# Patient Record
Sex: Female | Born: 1945 | Race: Black or African American | Hispanic: No | Marital: Married
Health system: Southern US, Community
[De-identification: ages and names within clinical notes are randomized; demographics above are authoritative.]

---

## 2019-11-24 ENCOUNTER — Observation Stay (HOSPITAL_COMMUNITY): Payer: Medicare Other

## 2019-11-24 ENCOUNTER — Encounter (HOSPITAL_COMMUNITY): Payer: Self-pay | Admitting: Family Medicine

## 2019-11-24 ENCOUNTER — Inpatient Hospital Stay (HOSPITAL_COMMUNITY)
Admission: EM | Admit: 2019-11-24 | Discharge: 2019-12-26 | DRG: 177 | Disposition: E | Payer: Medicare Other | Source: Ambulatory Visit | Attending: Internal Medicine | Admitting: Internal Medicine

## 2019-11-24 ENCOUNTER — Emergency Department (HOSPITAL_COMMUNITY): Payer: Medicare Other

## 2019-11-24 DIAGNOSIS — R627 Adult failure to thrive: Secondary | ICD-10-CM | POA: Diagnosis present

## 2019-11-24 DIAGNOSIS — J9601 Acute respiratory failure with hypoxia: Secondary | ICD-10-CM | POA: Diagnosis present

## 2019-11-24 DIAGNOSIS — Z66 Do not resuscitate: Secondary | ICD-10-CM

## 2019-11-24 DIAGNOSIS — E785 Hyperlipidemia, unspecified: Secondary | ICD-10-CM | POA: Diagnosis present

## 2019-11-24 DIAGNOSIS — Z992 Dependence on renal dialysis: Secondary | ICD-10-CM

## 2019-11-24 DIAGNOSIS — Z881 Allergy status to other antibiotic agents status: Secondary | ICD-10-CM

## 2019-11-24 DIAGNOSIS — M2450 Contracture, unspecified joint: Secondary | ICD-10-CM | POA: Diagnosis present

## 2019-11-24 DIAGNOSIS — G9340 Encephalopathy, unspecified: Secondary | ICD-10-CM | POA: Diagnosis not present

## 2019-11-24 DIAGNOSIS — G9341 Metabolic encephalopathy: Secondary | ICD-10-CM | POA: Diagnosis present

## 2019-11-24 DIAGNOSIS — J440 Chronic obstructive pulmonary disease with acute lower respiratory infection: Secondary | ICD-10-CM | POA: Diagnosis present

## 2019-11-24 DIAGNOSIS — J159 Unspecified bacterial pneumonia: Secondary | ICD-10-CM | POA: Diagnosis present

## 2019-11-24 DIAGNOSIS — E875 Hyperkalemia: Secondary | ICD-10-CM | POA: Diagnosis present

## 2019-11-24 DIAGNOSIS — U071 COVID-19: Secondary | ICD-10-CM | POA: Diagnosis not present

## 2019-11-24 DIAGNOSIS — N186 End stage renal disease: Secondary | ICD-10-CM | POA: Diagnosis not present

## 2019-11-24 DIAGNOSIS — Z7401 Bed confinement status: Secondary | ICD-10-CM

## 2019-11-24 DIAGNOSIS — E039 Hypothyroidism, unspecified: Secondary | ICD-10-CM | POA: Diagnosis present

## 2019-11-24 DIAGNOSIS — J449 Chronic obstructive pulmonary disease, unspecified: Secondary | ICD-10-CM | POA: Diagnosis present

## 2019-11-24 DIAGNOSIS — I953 Hypotension of hemodialysis: Secondary | ICD-10-CM | POA: Diagnosis present

## 2019-11-24 DIAGNOSIS — I469 Cardiac arrest, cause unspecified: Secondary | ICD-10-CM | POA: Diagnosis not present

## 2019-11-24 DIAGNOSIS — J1282 Pneumonia due to coronavirus disease 2019: Secondary | ICD-10-CM | POA: Diagnosis present

## 2019-11-24 DIAGNOSIS — Z515 Encounter for palliative care: Secondary | ICD-10-CM

## 2019-11-24 DIAGNOSIS — D7589 Other specified diseases of blood and blood-forming organs: Secondary | ICD-10-CM | POA: Diagnosis present

## 2019-11-24 DIAGNOSIS — Z888 Allergy status to other drugs, medicaments and biological substances status: Secondary | ICD-10-CM

## 2019-11-24 DIAGNOSIS — F419 Anxiety disorder, unspecified: Secondary | ICD-10-CM | POA: Diagnosis present

## 2019-11-24 HISTORY — DX: Anxiety disorder, unspecified: F41.9

## 2019-11-24 HISTORY — DX: End stage renal disease: N18.6

## 2019-11-24 HISTORY — DX: Chronic obstructive pulmonary disease, unspecified: J44.9

## 2019-11-24 LAB — CBC WITH DIFFERENTIAL/PLATELET
Abs Immature Granulocytes: 0 10*3/uL (ref 0.00–0.07)
Basophils Absolute: 0 10*3/uL (ref 0.0–0.1)
Basophils Relative: 0 %
Eosinophils Absolute: 0 10*3/uL (ref 0.0–0.5)
Eosinophils Relative: 0 %
HCT: 39.5 % (ref 36.0–46.0)
Hemoglobin: 12 g/dL (ref 12.0–15.0)
Lymphocytes Relative: 2 %
Lymphs Abs: 0.5 10*3/uL — ABNORMAL LOW (ref 0.7–4.0)
MCH: 31.3 pg (ref 26.0–34.0)
MCHC: 30.4 g/dL (ref 30.0–36.0)
MCV: 103.1 fL — ABNORMAL HIGH (ref 80.0–100.0)
Monocytes Absolute: 0.9 10*3/uL (ref 0.1–1.0)
Monocytes Relative: 4 %
Neutro Abs: 21.4 10*3/uL — ABNORMAL HIGH (ref 1.7–7.7)
Neutrophils Relative %: 94 %
Platelets: 499 10*3/uL — ABNORMAL HIGH (ref 150–400)
RBC: 3.83 MIL/uL — ABNORMAL LOW (ref 3.87–5.11)
RDW: 12.8 % (ref 11.5–15.5)
WBC: 22.8 10*3/uL — ABNORMAL HIGH (ref 4.0–10.5)
nRBC: 0 % (ref 0.0–0.2)
nRBC: 0 /100 WBC

## 2019-11-24 LAB — POC SARS CORONAVIRUS 2 AG -  ED: SARS Coronavirus 2 Ag: POSITIVE — AB

## 2019-11-24 LAB — TRIGLYCERIDES: Triglycerides: 364 mg/dL — ABNORMAL HIGH (ref <150)

## 2019-11-24 LAB — BASIC METABOLIC PANEL
Anion gap: 19 — ABNORMAL HIGH (ref 5–15)
BUN: 77 mg/dL — ABNORMAL HIGH (ref 8–23)
CO2: 26 mmol/L (ref 22–32)
Calcium: 9.3 mg/dL (ref 8.9–10.3)
Chloride: 96 mmol/L — ABNORMAL LOW (ref 98–111)
Creatinine, Ser: 7.03 mg/dL — ABNORMAL HIGH (ref 0.44–1.00)
GFR calc Af Amer: 6 mL/min — ABNORMAL LOW (ref >60)
GFR calc non Af Amer: 5 mL/min — ABNORMAL LOW (ref >60)
Glucose, Bld: 144 mg/dL — ABNORMAL HIGH (ref 70–99)
Potassium: 5.6 mmol/L — ABNORMAL HIGH (ref 3.5–5.1)
Sodium: 141 mmol/L (ref 135–145)

## 2019-11-24 LAB — PROCALCITONIN: Procalcitonin: 2.21 ng/mL

## 2019-11-24 LAB — HIV ANTIBODY (ROUTINE TESTING W REFLEX): HIV Screen 4th Generation wRfx: NONREACTIVE

## 2019-11-24 LAB — LACTIC ACID, PLASMA: Lactic Acid, Venous: 1.1 mmol/L (ref 0.5–1.9)

## 2019-11-24 LAB — VITAMIN B12: Vitamin B-12: 870 pg/mL (ref 180–914)

## 2019-11-24 LAB — AMMONIA: Ammonia: 43 umol/L — ABNORMAL HIGH (ref 9–35)

## 2019-11-24 LAB — D-DIMER, QUANTITATIVE: D-Dimer, Quant: 1.03 ug/mL-FEU — ABNORMAL HIGH (ref 0.00–0.50)

## 2019-11-24 LAB — FERRITIN: Ferritin: 5834 ng/mL — ABNORMAL HIGH (ref 11–307)

## 2019-11-24 LAB — TSH: TSH: 0.624 u[IU]/mL (ref 0.350–4.500)

## 2019-11-24 LAB — FIBRINOGEN: Fibrinogen: >800 mg/dL — ABNORMAL HIGH (ref 210–475)

## 2019-11-24 LAB — C-REACTIVE PROTEIN: CRP: 14.3 mg/dL — ABNORMAL HIGH (ref <1.0)

## 2019-11-24 LAB — LACTATE DEHYDROGENASE: LDH: 220 U/L — ABNORMAL HIGH (ref 98–192)

## 2019-11-24 MED ORDER — APIXABAN 2.5 MG PO TABS: 2.5000 mg | ORAL_TABLET | Freq: Two times a day (BID) | ORAL | Status: DC

## 2019-11-24 MED ORDER — LEVOTHYROXINE SODIUM 75 MCG PO TABS
150.0000 ug | ORAL_TABLET | Freq: Every day | ORAL | Status: DC
  Administered 2019-11-25: 05:00:00 150 ug via ORAL
  Filled 2019-11-24: qty 2

## 2019-11-24 MED ORDER — DEXAMETHASONE SODIUM PHOSPHATE 10 MG/ML IJ SOLN
6.0000 mg | INTRAMUSCULAR | Status: DC
  Administered 2019-11-24 – 2019-11-26 (×3): 6 mg via INTRAVENOUS
  Filled 2019-11-24 (×3): qty 1

## 2019-11-24 MED ORDER — SODIUM CHLORIDE 0.9 % IV SOLN
100.0000 mg | INTRAVENOUS | Status: DC
  Administered 2019-11-25: 100 mg via INTRAVENOUS
  Filled 2019-11-24: qty 100

## 2019-11-24 MED ORDER — SODIUM CHLORIDE 0.9% FLUSH
3.0000 mL | Freq: Two times a day (BID) | INTRAVENOUS | Status: DC
  Administered 2019-11-25 – 2019-11-28 (×8): 3 mL via INTRAVENOUS

## 2019-11-24 MED ORDER — SODIUM CHLORIDE 0.9 % IV SOLN
200.0000 mg | Freq: Once | INTRAVENOUS | Status: AC
  Administered 2019-11-24: 200 mg via INTRAVENOUS
  Filled 2019-11-24: qty 200

## 2019-11-24 MED ORDER — SODIUM CHLORIDE 0.9 % IV SOLN: 200.0000 mg | Freq: Once | INTRAVENOUS | Status: DC

## 2019-11-24 MED ORDER — ONDANSETRON HCL 4 MG PO TABS: 4.0000 mg | ORAL_TABLET | Freq: Four times a day (QID) | ORAL | Status: DC | PRN

## 2019-11-24 MED ORDER — HEPARIN SODIUM (PORCINE) 5000 UNIT/ML IJ SOLN
5000.0000 [IU] | Freq: Three times a day (TID) | INTRAMUSCULAR | Status: DC
  Administered 2019-11-24 – 2019-11-26 (×7): 5000 [IU] via SUBCUTANEOUS
  Filled 2019-11-24 (×7): qty 1

## 2019-11-24 MED ORDER — IPRATROPIUM-ALBUTEROL 20-100 MCG/ACT IN AERS
1.0000 | INHALATION_SPRAY | Freq: Four times a day (QID) | RESPIRATORY_TRACT | Status: DC
  Administered 2019-11-24 – 2019-11-25 (×5): 1 via RESPIRATORY_TRACT
  Filled 2019-11-24 (×2): qty 4

## 2019-11-24 MED ORDER — SODIUM CHLORIDE 0.9 % IV SOLN: 100.0000 mg | Freq: Every day | INTRAVENOUS | Status: DC

## 2019-11-24 MED ORDER — ONDANSETRON HCL 4 MG/2ML IJ SOLN: 4.0000 mg | Freq: Four times a day (QID) | INTRAMUSCULAR | Status: DC | PRN

## 2019-11-24 MED ORDER — ACETAMINOPHEN 325 MG PO TABS: 650.0000 mg | ORAL_TABLET | Freq: Four times a day (QID) | ORAL | Status: DC | PRN

## 2019-11-24 MED ORDER — ALBUTEROL SULFATE HFA 108 (90 BASE) MCG/ACT IN AERS
2.0000 | INHALATION_SPRAY | RESPIRATORY_TRACT | Status: DC | PRN
  Filled 2019-11-24: qty 6.7

## 2019-11-24 NOTE — ED Triage Notes (Signed)
Pt here from dialysis center for low b/p , pt may or may not have had dialysis yesterday , pt is from [facility] and was cvoid positive on dec 23rd

## 2019-11-24 NOTE — H&P (Addendum)
History and Physical    Jeanette Simon [id number] DOB: 12/31/1945 DOA: 11/07/2019  PCP: Patient, No Pcp Per   Patient coming from: SNF, [facility]   Chief Complaint: Decreased LOC, low BP  HPI: Jeanette Simon is a 73 y.o. female with medical history significant for COPD, anxiety, ESRD on hemodialysis, now presenting from her dialysis center where she had systolic blood pressure in the upper 90s/low 100s.  Per report of the patient's SNF, she was diagnosed with COVID-19 approximately a week ago, has had a new oxygen requirement, and has had decreased alertness over the past week.  Patient's family reports that she is typically alert and interactive.  Patient's family is concerned about her ongoing tachypnea and decreased level of consciousness.  No recent fall or trauma reported and no acute focal deficits have been noted.  ED Course: Upon arrival to the ED, patient is found to be afebrile, saturating high 90s on 3 L/min of supplemental oxygen, tachypneic in the low 30s, and with blood pressure 93/53.  Chest x-ray is negative for edema or consolidation.  Chemistry panel notable for BUN of 77 and potassium of 5.6.  CBC with leukocytosis to 22,800, macrocytosis without anemia, and thrombocytosis.  Covid antigen test was positive.  EKG and blood work have been ordered from the ED but not yet collected, and hospitalist asked to admit.  Review of Systems:  Unable to complete ROS secondary the patient's clinical condition.  Past Medical History:  Diagnosis Date  . Anxiety 11/20/2019  . COPD (chronic obstructive pulmonary disease) ([place]) 11/19/2019  . ESRD (end stage renal disease) ([place]) 11/23/2019    History reviewed. No pertinent surgical history.   has no history on file for tobacco, alcohol, and drug.  Allergies  Allergen Reactions  . Augmentin [Amoxicillin-Pot Clavulanate]     Unknown, per records  . Compazine [Prochlorperazine]     Unknown, per records  . Gabapentin Nausea And  Vomiting    History reviewed. No pertinent family history.   Prior to Admission medications   Not on File    Physical Exam: Vitals:   11/05/2019 1815 11/14/2019 1830 11/09/2019 1845 11/07/2019 1900  BP: 119/85 124/77 125/72 125/75  Pulse: 100 (!) 103  (!) 101  Resp: (!) 30 (!) 31 (!) 28 (!) 32  Temp:      SpO2: 99% 98%  99%    Constitutional: NAD, calm, frail appearing  Eyes: PERTLA, lids and conjunctivae normal ENMT: Mucous membranes are moist. Posterior pharynx clear of any exudate or lesions.   Neck: normal, supple, no masses, no thyromegaly Respiratory:  no wheezing, no crackles. Tachypneic. No pallor, cyanosis, or acute distress.  Cardiovascular: S1 & S2 heard, regular rate and rhythm. No extremity edema.   Abdomen: No distension, no tenderness, soft. Bowel sounds active.  Musculoskeletal: no clubbing / cyanosis. No joint deformity upper and lower extremities.  Skin: no significant rashes, lesions, ulcers. Warm, dry, well-perfused. Neurologic: No gross facial asymmetry, PERRL, not speaking. Moving all extremities. Lethargic and generally weak, right grip strength > left.  Psychiatric: Calm, makes eye-contact, not speaking.     Labs on Admission: I have personally reviewed following labs and imaging studies  CBC: Recent Labs  Lab 11/06/2019 1348  WBC 22.8*  NEUTROABS 21.4*  HGB 12.0  HCT 39.5  MCV 103.1*  PLT 99991111*   Basic Metabolic Panel: Recent Labs  Lab 11/07/2019 1348  NA 141  K 5.6*  CL 96*  CO2 26  GLUCOSE 144*  BUN 77*  CREATININE 7.03*  CALCIUM 9.3   GFR: CrCl cannot be calculated (Unknown ideal weight.). Liver Function Tests: No results for input(s): AST, ALT, ALKPHOS, BILITOT, PROT, ALBUMIN in the last 168 hours. No results for input(s): LIPASE, AMYLASE in the last 168 hours. No results for input(s): AMMONIA in the last 168 hours. Coagulation Profile: No results for input(s): INR, PROTIME in the last 168 hours. Cardiac Enzymes: No results for  input(s): CKTOTAL, CKMB, CKMBINDEX, TROPONINI in the last 168 hours. BNP (last 3 results) No results for input(s): PROBNP in the last 8760 hours. HbA1C: No results for input(s): HGBA1C in the last 72 hours. CBG: No results for input(s): GLUCAP in the last 168 hours. Lipid Profile: No results for input(s): CHOL, HDL, LDLCALC, TRIG, CHOLHDL, LDLDIRECT in the last 72 hours. Thyroid Function Tests: No results for input(s): TSH, T4TOTAL, FREET4, T3FREE, THYROIDAB in the last 72 hours. Anemia Panel: No results for input(s): VITAMINB12, FOLATE, FERRITIN, TIBC, IRON, RETICCTPCT in the last 72 hours. Urine analysis: No results found for: COLORURINE, APPEARANCEUR, LABSPEC, PHURINE, GLUCOSEU, HGBUR, BILIRUBINUR, KETONESUR, PROTEINUR, UROBILINOGEN, NITRITE, LEUKOCYTESUR Sepsis Labs: @LABRCNTIP (procalcitonin:4,lacticidven:4) )No results found for this or any previous visit (from the past 240 hour(s)).   Radiological Exams on Admission: DG Chest Port 1 View  Result Date: 11/17/2019 CLINICAL DATA:  Weakness and hypotension.  Renal failure. EXAM: PORTABLE CHEST 1 VIEW COMPARISON:  None FINDINGS: No edema or consolidation. Heart size and pulmonary vascularity are normal. No adenopathy. There is aortic atherosclerosis. No bone lesions. IMPRESSION: No edema or consolidation. Heart size normal. No adenopathy. Aortic Atherosclerosis (ICD10-I70.0). Electronically Signed   By: Lowella Grip III M.D.   On: 11/10/2019 14:13    EKG: Independently reviewed. Sinus rhythm, rate 99, normal QRS and QTc.   Assessment/Plan   1. Acute encephalopathy  - Presents from HD with decreased LOC and increased RR since last week after COVID-19 diagnosis  - She is alert and interactive at baseline per report, now not speaking though has been following commands  - She is awake on admission and follows commands intermittently, moving all extremities with grip strength better on right than left - DDx is broad, likely toxic  metabolic, sequela of [id number], CVA difficult to exclude  - Check head CT, TSH, B12, ammonia, RPR  - Continue supportive care    2. COVID-19 infection; hypoxic respiratory failure   - Presents with decreased LOC, increased RR, and has had a new 3 Lpm supplemental O2 requirement at her SNF  - No acute findings noted on CXR, saturating upper 90's on 3 Lpm with RR ~30  - Continue steroids, start remdesivir, continue supplemental O2 as needed to keep sat >90% while awake, check/trend inflammatory markers    3. COPD  - No wheezing on admission  - Continue inhalers, steroids as above, supplemental O2 as needed   4. ESRD; hyperkalemia  - Patient reportedly completed HD on 12/30, was sent again on 12/31 for unclear indication, and has mild hyperkalemia (5.6) and BUN 77 on admission, not hypertensive or hypervolemic  - Check EKG, continue cardiac monitoring, repeat potassium levels, SLIV, renally-dose medications   5. SIRS  - WBC is 22k and patient is tachypneic and tachycardic with new O2 requirement  - Likely secondary to COVID-19 and recent steroid use, blood cultures and procalcitonin are pending, hold antibiotics for now    6. Hypothyroidism  - Check TSH as above, continue Synthroid     DVT prophylaxis: Eliquis   Code Status: DNR, paperwork at bedside  Family Communication: Family updated from ED  Consults called: None  Admission status: Observation     Vianne Bulls, MD [facility] Pager [phone]  If 7PM-7AM, please contact night-coverage [email or web address] Password [email or web address]  11/08/2019, 7:40 PM

## 2019-11-24 NOTE — ED Provider Notes (Signed)
I assumed care of patient from previous team, please see their note for full H&P.  Briefly patient is known Covid positive here from [facility].  She went to dialysis and was reportedly hypotensive compared to her normal and was sent here.  Physical Exam  BP 102/74   Pulse 96   Temp 98.4 F (36.9 C)   Resp (!) 31   SpO2 100%   Physical Exam Vitals and nursing note reviewed.  Constitutional:      Interventions: Nasal cannula in place.  Cardiovascular:     Rate and Rhythm: Tachycardia present.  Pulmonary:     Effort: Tachypnea present. No prolonged expiration.  Neurological:     Mental Status: She is easily aroused.     GCS: GCS eye subscore is 3. GCS verbal subscore is 2. GCS motor subscore is 5.     Comments: Patient opens eyes to name, will look at me.  She is unable to follow commands, unable to squeeze fingers bilaterally.  She is unable to close her eyes when directed to.       ED Course/Procedures   Clinical Course as of Nov 23 1936  Thu Nov 24, 2019  1517 Call to [facility], spoke with patient's nurse, patient was more alert last week, given O2 last week. Dialysis yesterday, was taken back to dialysis today for unknown reason. Not as talkative today, not normally alert to person/place/time. BP 112/69 earlier today with sat 94% (on 3L [place]).  Brought in by EMS from dialysis for low BP today. BP 102/74 currently. Patient does not answer questions, is awake, follows simple commands (squeeze hands). Has MOST form at bedside (necessary abx and fluids, do not resuscitate).    [LM]  [place] signed out to Wyn Quaker, PA-C pending BMP.   [LM]  [place] son [id number] Janina Mayo daughter [phone]   [EH]  [id number] Spoke with Dr. Myna Hidalgo who will see patient.     [EH]    Clinical Course User Index [EH] Lorin Glass, PA-C [LM] Tacy Learn, PA-C    Procedures   Labs Reviewed  BASIC METABOLIC PANEL - Abnormal; Notable for the following components:       Result Value   Potassium 5.6 (*)    Chloride 96 (*)    Glucose, Bld 144 (*)    BUN 77 (*)    Creatinine, Ser 7.03 (*)    GFR calc non Af Amer 5 (*)    GFR calc Af Amer 6 (*)    Anion gap 19 (*)    All other components within normal limits  CBC WITH DIFFERENTIAL/PLATELET - Abnormal; Notable for the following components:   WBC 22.8 (*)    RBC 3.83 (*)    MCV 103.1 (*)    Platelets 499 (*)    Neutro Abs 21.4 (*)    Lymphs Abs 0.5 (*)    All other components within normal limits  D-DIMER, QUANTITATIVE (NOT AT [facility]) - Abnormal; Notable for the following components:   D-Dimer, Quant 1.03 (*)    All other components within normal limits  LACTATE DEHYDROGENASE - Abnormal; Notable for the following components:   LDH 220 (*)    All other components within normal limits  FERRITIN - Abnormal; Notable for the following components:   Ferritin 5,834 (*)    All other components within normal limits  TRIGLYCERIDES - Abnormal; Notable for the following components:   Triglycerides 364 (*)    All other components within  normal limits  FIBRINOGEN - Abnormal; Notable for the following components:   Fibrinogen >800 (*)    All other components within normal limits  C-REACTIVE PROTEIN - Abnormal; Notable for the following components:   CRP 14.3 (*)    All other components within normal limits  AMMONIA - Abnormal; Notable for the following components:   Ammonia 43 (*)    All other components within normal limits  POC SARS CORONAVIRUS 2 AG -  ED - Abnormal; Notable for the following components:   SARS Coronavirus 2 Ag POSITIVE (*)    All other components within normal limits  CULTURE, BLOOD (ROUTINE X 2)  CULTURE, BLOOD (ROUTINE X 2)  LACTIC ACID, PLASMA  PROCALCITONIN  TSH  VITAMIN B12  HIV ANTIBODY (ROUTINE TESTING W REFLEX)  LACTIC ACID, PLASMA  RPR  POTASSIUM  CBC WITH DIFFERENTIAL/PLATELET  COMPREHENSIVE METABOLIC PANEL  C-REACTIVE PROTEIN  D-DIMER, QUANTITATIVE (NOT AT [facility])   FERRITIN  MAGNESIUM  PHOSPHORUS  ABO/RH     MDM   Patient presents today for evaluation of possible hypotension from dialysis. Plan is to follow-up on labs.  She is known Covid positive.  While here she was tachycardic and tachypneic.  She had been started on oxygen by the facility along with Eliquis.  When I examined her she was unable to consistently respond to commands.  She would follow me with her eyes and open her eyes to voice however was not able to squeeze my hands.  I spoke with patient's family who, after deliberation, wished for patient to be admitted.  Patient was dialyzed yesterday.  Chest x-ray without frank volume overload.  I spoke with Dr. Myna Hidalgo who will see the patient for admission.   Note: Portions of this report may have been transcribed using voice recognition software. Every effort was made to ensure accuracy; however, inadvertent computerized transcription errors may be present         Lorin Glass, PA-C 11/25/19 0104    Maudie Flakes, MD 11/25/19 [phone]

## 2019-11-24 NOTE — ED Provider Notes (Signed)
[facility] EMERGENCY DEPARTMENT Provider Note   CSN: [id number] Arrival date & time: 11/21/2019  1322     History No chief complaint on file.   Jeanette Simon is a 73 y.o. female.  73 year old female history of COPD, end-stage renal disease, hyperlipidemia, anxiety, brought in from dialysis center for low blood pressure today.  Patient recently had a positive Covid test on December 23, review of limited records- was started on Elaquis and Decadron.  Unsure when patient last had dialysis or if she completed dialysis today.  On arrival patient's blood pressure is 100/61.  Patient is unable to provide history.        No past medical history on file.  There are no problems to display for this patient.    OB History   No obstetric history on file.     No family history on file.  Social History   Tobacco Use  . Smoking status: Not on file  Substance Use Topics  . Alcohol use: Not on file  . Drug use: Not on file    Home Medications Prior to Admission medications   Not on File    Allergies    Patient has no allergy information on record.  Review of Systems   Review of Systems  Unable to perform ROS: Patient nonverbal    Physical Exam Updated Vital Signs BP 102/74   Pulse 96   Temp 98.4 F (36.9 C)   Resp (!) 31   SpO2 100%   Physical Exam Vitals and nursing note reviewed.  Constitutional:      Comments: Awake, chronically ill-appearing  HENT:     Head: Normocephalic and atraumatic.  Eyes:     Pupils: Pupils are equal, round, and reactive to light.  Cardiovascular:     Rate and Rhythm: Normal rate and regular rhythm.     Pulses: Normal pulses.     Heart sounds: Normal heart sounds.  Pulmonary:     Effort: Pulmonary effort is normal.     Breath sounds: Normal breath sounds.  Abdominal:     Palpations: Abdomen is soft.     Tenderness: There is no abdominal tenderness.  Musculoskeletal:     Right lower leg: No edema.     Left  lower leg: No edema.  Skin:    General: Skin is warm and dry.  Neurological:     Mental Status: Mental status is at baseline.     Comments: Follows simple commands (squeeze hands).     ED Results / Procedures / Treatments   Labs (all labs ordered are listed, but only abnormal results are displayed) Labs Reviewed  BASIC METABOLIC PANEL - Abnormal; Notable for the following components:      Result Value   Potassium 5.6 (*)    Chloride 96 (*)    Glucose, Bld 144 (*)    BUN 77 (*)    Creatinine, Ser 7.03 (*)    GFR calc non Af Amer 5 (*)    GFR calc Af Amer 6 (*)    Anion gap 19 (*)    All other components within normal limits  CBC WITH DIFFERENTIAL/PLATELET - Abnormal; Notable for the following components:   WBC 22.8 (*)    RBC 3.83 (*)    MCV 103.1 (*)    Platelets 499 (*)    All other components within normal limits    EKG None  Radiology DG Chest Port 1 View  Result Date: 11/10/2019 CLINICAL DATA:  Weakness and hypotension.  Renal failure. EXAM: PORTABLE CHEST 1 VIEW COMPARISON:  None FINDINGS: No edema or consolidation. Heart size and pulmonary vascularity are normal. No adenopathy. There is aortic atherosclerosis. No bone lesions. IMPRESSION: No edema or consolidation. Heart size normal. No adenopathy. Aortic Atherosclerosis (ICD10-I70.0). Electronically Signed   By: Lowella Grip III M.D.   On: 11/12/2019 14:13    Procedures Procedures (including critical care time)  Medications Ordered in ED Medications - No data to display  ED Course  I have reviewed the triage vital signs and the nursing notes.  Pertinent labs & imaging results that were available during my care of the patient were reviewed by me and considered in my medical decision making (see chart for details).  Clinical Course as of Nov 23 1536  Thu Nov 24, 2019  1517 Call to [facility], spoke with patient's nurse, patient was more alert last week, given O2 last week. Dialysis yesterday, was  taken back to dialysis today for unknown reason. Not as talkative today, not normally alert to person/place/time. BP 112/69 earlier today with sat 94% (on 3L [place]).  Brought in by EMS from dialysis for low BP today. BP 102/74 currently. Patient does not answer questions, is awake, follows simple commands (squeeze hands). Has MOST form at bedside (necessary abx and fluids, do not resuscitate).    [LM]  [place] signed out to Wyn Quaker, PA-C pending BMP.   [LM]    Clinical Course User Index [LM] Jeanette Simon   MDM Rules/Calculators/A&P                      Final Clinical Impression(s) / ED Diagnoses Final diagnoses:  None    Rx / DC Orders ED Discharge Orders    None       Jeanette Simon 10/28/2019 1537    Jeanette Rank, MD 11/30/19 2310

## 2019-11-25 DIAGNOSIS — E875 Hyperkalemia: Secondary | ICD-10-CM | POA: Diagnosis present

## 2019-11-25 DIAGNOSIS — Z515 Encounter for palliative care: Secondary | ICD-10-CM | POA: Diagnosis not present

## 2019-11-25 DIAGNOSIS — Z888 Allergy status to other drugs, medicaments and biological substances status: Secondary | ICD-10-CM | POA: Diagnosis not present

## 2019-11-25 DIAGNOSIS — I953 Hypotension of hemodialysis: Secondary | ICD-10-CM | POA: Diagnosis present

## 2019-11-25 DIAGNOSIS — M2450 Contracture, unspecified joint: Secondary | ICD-10-CM | POA: Diagnosis present

## 2019-11-25 DIAGNOSIS — F419 Anxiety disorder, unspecified: Secondary | ICD-10-CM | POA: Diagnosis present

## 2019-11-25 DIAGNOSIS — Z881 Allergy status to other antibiotic agents status: Secondary | ICD-10-CM | POA: Diagnosis not present

## 2019-11-25 DIAGNOSIS — E039 Hypothyroidism, unspecified: Secondary | ICD-10-CM | POA: Diagnosis present

## 2019-11-25 DIAGNOSIS — Z7401 Bed confinement status: Secondary | ICD-10-CM | POA: Diagnosis not present

## 2019-11-25 DIAGNOSIS — E785 Hyperlipidemia, unspecified: Secondary | ICD-10-CM | POA: Diagnosis present

## 2019-11-25 DIAGNOSIS — J159 Unspecified bacterial pneumonia: Secondary | ICD-10-CM | POA: Diagnosis present

## 2019-11-25 DIAGNOSIS — G9340 Encephalopathy, unspecified: Secondary | ICD-10-CM | POA: Diagnosis present

## 2019-11-25 DIAGNOSIS — Z66 Do not resuscitate: Secondary | ICD-10-CM

## 2019-11-25 DIAGNOSIS — G9341 Metabolic encephalopathy: Secondary | ICD-10-CM | POA: Diagnosis present

## 2019-11-25 DIAGNOSIS — D7589 Other specified diseases of blood and blood-forming organs: Secondary | ICD-10-CM | POA: Diagnosis present

## 2019-11-25 DIAGNOSIS — I469 Cardiac arrest, cause unspecified: Secondary | ICD-10-CM | POA: Diagnosis not present

## 2019-11-25 DIAGNOSIS — J9601 Acute respiratory failure with hypoxia: Secondary | ICD-10-CM | POA: Diagnosis present

## 2019-11-25 DIAGNOSIS — U071 COVID-19: Secondary | ICD-10-CM | POA: Diagnosis present

## 2019-11-25 DIAGNOSIS — J440 Chronic obstructive pulmonary disease with acute lower respiratory infection: Secondary | ICD-10-CM | POA: Diagnosis present

## 2019-11-25 DIAGNOSIS — R627 Adult failure to thrive: Secondary | ICD-10-CM | POA: Diagnosis present

## 2019-11-25 DIAGNOSIS — N186 End stage renal disease: Secondary | ICD-10-CM | POA: Diagnosis present

## 2019-11-25 DIAGNOSIS — J1282 Pneumonia due to coronavirus disease 2019: Secondary | ICD-10-CM | POA: Diagnosis present

## 2019-11-25 DIAGNOSIS — Z992 Dependence on renal dialysis: Secondary | ICD-10-CM | POA: Diagnosis not present

## 2019-11-25 LAB — ABO/RH: ABO/RH(D): A POS

## 2019-11-25 LAB — CBC WITH DIFFERENTIAL/PLATELET
Abs Immature Granulocytes: 1.27 10*3/uL — ABNORMAL HIGH (ref 0.00–0.07)
Basophils Absolute: 0.2 10*3/uL — ABNORMAL HIGH (ref 0.0–0.1)
Basophils Relative: 1 %
Eosinophils Absolute: 0 10*3/uL (ref 0.0–0.5)
Eosinophils Relative: 0 %
HCT: 39.1 % (ref 36.0–46.0)
Hemoglobin: 12.1 g/dL (ref 12.0–15.0)
Immature Granulocytes: 5 %
Lymphocytes Relative: 5 %
Lymphs Abs: 1.2 10*3/uL (ref 0.7–4.0)
MCH: 31.5 pg (ref 26.0–34.0)
MCHC: 30.9 g/dL (ref 30.0–36.0)
MCV: 101.8 fL — ABNORMAL HIGH (ref 80.0–100.0)
Monocytes Absolute: 0.7 10*3/uL (ref 0.1–1.0)
Monocytes Relative: 3 %
Neutro Abs: 20.3 10*3/uL — ABNORMAL HIGH (ref 1.7–7.7)
Neutrophils Relative %: 86 %
Platelets: 562 10*3/uL — ABNORMAL HIGH (ref 150–400)
RBC: 3.84 MIL/uL — ABNORMAL LOW (ref 3.87–5.11)
RDW: 12.9 % (ref 11.5–15.5)
WBC: 23.6 10*3/uL — ABNORMAL HIGH (ref 4.0–10.5)
nRBC: 0 % (ref 0.0–0.2)

## 2019-11-25 LAB — COMPREHENSIVE METABOLIC PANEL
ALT: 27 U/L (ref 0–44)
AST: 31 U/L (ref 15–41)
Albumin: 2.6 g/dL — ABNORMAL LOW (ref 3.5–5.0)
Alkaline Phosphatase: 66 U/L (ref 38–126)
Anion gap: 24 — ABNORMAL HIGH (ref 5–15)
BUN: 100 mg/dL — ABNORMAL HIGH (ref 8–23)
CO2: 25 mmol/L (ref 22–32)
Calcium: 9.3 mg/dL (ref 8.9–10.3)
Chloride: 95 mmol/L — ABNORMAL LOW (ref 98–111)
Creatinine, Ser: 8.08 mg/dL — ABNORMAL HIGH (ref 0.44–1.00)
GFR calc Af Amer: 5 mL/min — ABNORMAL LOW (ref >60)
GFR calc non Af Amer: 4 mL/min — ABNORMAL LOW (ref >60)
Glucose, Bld: 127 mg/dL — ABNORMAL HIGH (ref 70–99)
Potassium: 5.8 mmol/L — ABNORMAL HIGH (ref 3.5–5.1)
Sodium: 144 mmol/L (ref 135–145)
Total Bilirubin: 0.6 mg/dL (ref 0.3–1.2)
Total Protein: 7.2 g/dL (ref 6.5–8.1)

## 2019-11-25 LAB — MAGNESIUM: Magnesium: 2.8 mg/dL — ABNORMAL HIGH (ref 1.7–2.4)

## 2019-11-25 LAB — RPR: RPR Ser Ql: NONREACTIVE

## 2019-11-25 LAB — C-REACTIVE PROTEIN: CRP: 13.9 mg/dL — ABNORMAL HIGH (ref <1.0)

## 2019-11-25 LAB — PHOSPHORUS: Phosphorus: 9.5 mg/dL — ABNORMAL HIGH (ref 2.5–4.6)

## 2019-11-25 LAB — FERRITIN: Ferritin: 7415 ng/mL — ABNORMAL HIGH (ref 11–307)

## 2019-11-25 LAB — D-DIMER, QUANTITATIVE: D-Dimer, Quant: 1.05 ug/mL-FEU — ABNORMAL HIGH (ref 0.00–0.50)

## 2019-11-25 LAB — LACTIC ACID, PLASMA: Lactic Acid, Venous: 1.3 mmol/L (ref 0.5–1.9)

## 2019-11-25 MED ORDER — FENTANYL CITRATE (PF) 100 MCG/2ML IJ SOLN
25.0000 ug | INTRAMUSCULAR | Status: DC | PRN
  Administered 2019-11-27 – 2019-11-28 (×7): 25 ug via INTRAVENOUS
  Filled 2019-11-25 (×7): qty 2

## 2019-11-25 NOTE — Progress Notes (Signed)
Patient set up at [facility] isolation shift, TTS 12:00pm, with 11:45am arrival time.   Alphonzo Cruise,  Renal Navigator [phone]

## 2019-11-25 NOTE — ED Notes (Signed)
Was told that Pt. Could respond yes or no. Offered food try and placed where the Pt. Could see and smell the food. The Pt. Did not respond in reference to the presentation and does not seem to want to eat. Will attempt later

## 2019-11-25 NOTE — ED Notes (Signed)
Dinner tray ordered.

## 2019-11-25 NOTE — Progress Notes (Addendum)
[facility]  PROGRESS NOTE  Jeanette Simon [id number] DOB: 1946-02-13 DOA: 10/26/2019 PCP: Patient, No Pcp Per   Brief HPI:   74 year old female with history of COPD, anxiety, ESRD on hemodialysis presented from dialysis center when her SBP was in the low 100s/above 90s.  Patient was diagnosed with COVID-19 infection approximately week ago.  She has new oxygen requirement and decreased alertness over past week.  As per patient's family she was typically alert and interactive.  In the ED patient was found to be afebrile.  Oxygen saturation high 90s on 3 L/min of oxygen.  Chest x-ray is negative for consolidation.  BUN 77, potassium 5.6.  She had leukocytosis with WBC 22,800.  Covid antigen test was positive.    Subjective   Patient seen and examined, responds minimally to verbal stimuli.   Assessment/Plan:     1. Acute encephalopathy-unclear etiology, patient has been declining for past 1 week after she was diagnosed with Covid 19 infection.  CT head is negative for stroke.  B12 is 870, TSH 0.624, RPR is nonreactive, ammonia is 43.  Patient encephalopathy is likely from COVID-19 infection.  Patient started on remdesivir and Decadron.  2. Acute hypoxic respiratory failure-in the setting of COVID-19 pneumonia, patient is requiring 2 L/min of oxygen, O2 sats 96%.  Currently started on remdesivir and Decadron.  Inflammatory markers show CRP 13.9, ferritin 74 1 5, D-dimer 1.05.  Patient does have leukocytosis but she has been on Decadron.  Procalcitonin is elevated at 2.21.  Will add empiric ceftriaxone and Zithromax for superimposed bacterial pneumonia.  Chest x-ray has been unremarkable.  3. ESRD with hyperkalemia-patient has ESRD on hemodialysis, patient became hypotensive during hemodialysis and was transferred to the [facility].  Nephrology was consulted, Dr. Jonnie Finner has seen the patient and recommends to stop hemodialysis.  4. Hypothyroidism-continue Synthroid  5. Goals of care  discussion-called and discussed with patient's daughter and explained worsening condition of patient.  Also explained the futility of hemodialysis under current circumstances.  Patient has been declining for past 7 to 8 days with poor p.o. intake.  She is barely responding to verbal stimuli.  Nephrology has seen the patient and recommends to stop hemodialysis.  Patient's daughter is going to discuss with her brother sisters and inform us about their decision regarding making patient comfort care.  Patient is DNR.    SpO2: 96 % O2 Flow Rate (L/min): 2 L/min   COVID-19 Labs  Recent Labs    11/15/2019 2003 11/25/19 0431  DDIMER 1.03* 1.05*  FERRITIN 5,834* 7,415*  LDH 220*  --   CRP 14.3* 13.9*      CBC: Recent Labs  Lab 11/15/2019 1348 11/25/19 0431  WBC 22.8* 23.6*  NEUTROABS 21.4* 20.3*  HGB 12.0 12.1  HCT 39.5 39.1  MCV 103.1* 101.8*  PLT 499* 562*    Basic Metabolic Panel: Recent Labs  Lab 10/28/2019 1348 11/25/19 0431  NA 141 144  K 5.6* 5.8*  CL 96* 95*  CO2 26 25  GLUCOSE 144* 127*  BUN 77* 100*  CREATININE 7.03* 8.08*  CALCIUM 9.3 9.3  MG  --  2.8*  PHOS  --  9.5*     Liver Function Tests: Recent Labs  Lab 11/25/19 0431  AST 31  ALT 27  ALKPHOS 66  BILITOT 0.6  PROT 7.2  ALBUMIN 2.6*        DVT prophylaxis: Heparin  Code Status: Full code  Family Communication: Discussed with daughter as above  Disposition Plan:  To be decided based on patient's clinical improvement         Scheduled medications:  . dexamethasone (DECADRON) injection  6 mg Intravenous Q24H  . heparin injection (subcutaneous)  5,000 Units Subcutaneous Q8H  . Ipratropium-Albuterol  1 puff Inhalation QID  . levothyroxine  150 mcg Oral Q0600  . sodium chloride flush  3 mL Intravenous Q12H      Antibiotics:   Anti-infectives (From admission, onward)   Start     Dose/Rate Route Frequency Ordered Stop   11/25/19 1000  remdesivir 100 mg in sodium chloride 0.9 %  100 mL IVPB  Status:  Discontinued     100 mg 200 mL/hr over 30 Minutes Intravenous Daily 11/05/2019 2050 11/23/2019 2051   11/25/19 1000  remdesivir 100 mg in sodium chloride 0.9 % 100 mL IVPB     100 mg 200 mL/hr over 30 Minutes Intravenous Every 24 hours 10/25/2019 1948 11/29/19 0959   10/25/2019 2050  remdesivir 200 mg in sodium chloride 0.9% 250 mL IVPB  Status:  Discontinued     200 mg 580 mL/hr over 30 Minutes Intravenous Once 11/23/2019 2050 10/26/2019 2051   10/31/2019 2000  remdesivir 200 mg in sodium chloride 0.9% 250 mL IVPB     200 mg 580 mL/hr over 30 Minutes Intravenous Once 11/04/2019 1948 11/17/2019 2150       Objective   Vitals:   11/25/19 1230 11/25/19 1245 11/25/19 1300 11/25/19 1315  BP: 124/74 129/76 124/75 130/83  Pulse:   (!) 104 (!) 105  Resp: (!) 33 (!) 33 (!) 31 (!) 33  Temp:      SpO2:   97% 96%    Intake/Output Summary (Last 24 hours) at 11/25/2019 1327 Last data filed at 11/25/2019 1148 Gross per 24 hour  Intake 350 ml  Output --  Net 350 ml    12/30 1901 - 01/01 0700 In: 250  Out: -    Physical Examination:    General: Appears lethargic  Cardiovascular: S1-S2, regular  Respiratory: Clear to auscultation bilaterally  Abdomen: Abdomen is soft, nontender, no organomegaly  Extremities: No edema in the lower extremities  Neurologic: Somnolent, arousable to verbal stimuli,    Data Reviewed:   Recent Results (from the past 240 hour(s))  Blood Culture (routine x 2)     Status: None (Preliminary result)   Collection Time: 11/09/2019  7:45 PM   Specimen: BLOOD RIGHT HAND  Result Value Ref Range Status   Specimen Description BLOOD RIGHT HAND  Final   Special Requests   Final    BOTTLES DRAWN AEROBIC AND ANAEROBIC Blood Culture results may not be optimal due to an inadequate volume of blood received in culture bottles   Culture   Final    NO GROWTH < 24 HOURS Performed at [facility], [place] [address] [place], [place] [address]    Report Status  PENDING  Incomplete  Blood Culture (routine x 2)     Status: None (Preliminary result)   Collection Time: 11/22/2019  8:05 PM   Specimen: BLOOD RIGHT ARM  Result Value Ref Range Status   Specimen Description BLOOD RIGHT ARM  Final   Special Requests   Final    BOTTLES DRAWN AEROBIC AND ANAEROBIC Blood Culture results may not be optimal due to an inadequate volume of blood received in culture bottles   Culture   Final    NO GROWTH < 24 HOURS Performed at [facility], [place] [address]  [place], [place] [address]    Report Status PENDING  Incomplete    No results for input(s): LIPASE, AMYLASE in the last 168 hours. Recent Labs  Lab 11/18/2019 2021  AMMONIA 43*     Studies:  CT HEAD WO CONTRAST  Result Date: 11/20/2019 CLINICAL DATA:  Tachypnea. Decreased level of consciousness. COVID positive. EXAM: CT HEAD WITHOUT CONTRAST TECHNIQUE: Contiguous axial images were obtained from the base of the skull through the vertex without intravenous contrast. COMPARISON:  None. FINDINGS: Brain: No evidence of acute infarction, hemorrhage, hydrocephalus, extra-axial collection or mass lesion/mass effect. Atrophy and chronic microvascular ischemic changes are noted. Vascular: No hyperdense vessel or unexpected calcification. Skull: Normal. Negative for fracture or focal lesion. Sinuses/Orbits: No acute finding. Other: None. IMPRESSION: No acute findings by noncontrast CT. Atrophy and chronic microvascular ischemic changes. Electronically Signed   By: Constance Holster M.D.   On: 11/13/2019 21:22   DG Chest Port 1 View  Result Date: 11/21/2019 CLINICAL DATA:  Weakness and hypotension.  Renal failure. EXAM: PORTABLE CHEST 1 VIEW COMPARISON:  None FINDINGS: No edema or consolidation. Heart size and pulmonary vascularity are normal. No adenopathy. There is aortic atherosclerosis. No bone lesions. IMPRESSION: No edema or consolidation. Heart size normal. No adenopathy. Aortic Atherosclerosis  (ICD10-I70.0). Electronically Signed   By: Lowella Grip III M.D.   On: 11/17/2019 14:13     Admission status: Inpatient: Based on patients clinical presentation and evaluation of above clinical data, I have made determination that patient meets Inpatient criteria at this time.   Oswald Hillock   [facility] If 7PM-7AM, please contact night-coverage at [email or web address], Office  [phone]  password [email or web address]  11/25/2019, 1:27 PM  LOS: 0 days

## 2019-11-25 NOTE — Progress Notes (Signed)
Renal Navigator notes plan to transition to comfort care. Navigator notified OP HD clinic/[facility].  Alphonzo Cruise, [place] Renal Navigator [phone]

## 2019-11-25 NOTE — ED Notes (Signed)
Lunch tray ordered 

## 2019-11-25 NOTE — Consult Note (Signed)
Renal Service Consult Note [facility]  Bindi Tipsword 11/25/2019 Sol Blazing Requesting Physician:  Dr Darrick Meigs  Reason for Consult:  ESRD pt w/ COVID+ infection HPI: The patient is a 74 y.o. year-old lives at [facility], sent to ED for low BP's and confusion. COVID + recently as of 12/23,  Unsure when last dialysis was. Pt has ESRD, unclear where getting HD. In ED pt was tachypneic and tachycardic. Confused and not responding verbally. Patient was reportedly dialyzed yesterday. Asked to see for renal failure.     Past Medical History  Past Medical History:  Diagnosis Date  . Anxiety 11/22/2019  . COPD (chronic obstructive pulmonary disease) ([place]) 11/14/2019  . ESRD (end stage renal disease) ([place]) 11/21/2019   Past Surgical History History reviewed. No pertinent surgical history. Family History History reviewed. No pertinent family history. Social History  has no history on file for tobacco, alcohol, and drug. Allergies  Allergies  Allergen Reactions  . Augmentin [Amoxicillin-Pot Clavulanate]     Unknown, per records  . Compazine [Prochlorperazine]     Unknown, per records  . Gabapentin Nausea And Vomiting   Home medications Prior to Admission medications   Medication Sig Start Date End Date Taking? Authorizing Provider  acetaminophen (TYLENOL) 650 MG CR tablet Take 1,300 mg by mouth every 8 (eight) hours as needed for pain.   Yes [provider]  ALPRAZolam Duanne Moron) 0.5 MG tablet Take 0.5 mg by mouth daily as needed. Before Dialysis   Yes [provider]  apixaban (ELIQUIS) 2.5 MG TABS tablet Take 2.5 mg by mouth 2 (two) times daily.   Yes [provider]  aspirin EC 81 MG tablet Take 81 mg by mouth daily.   Yes [provider]  Cholecalciferol (VITAMIN D) 50 MCG (2000 UT) tablet Take 2,000 Units by mouth daily.   Yes [provider]  clopidogrel (PLAVIX) 75 MG tablet Take 75 mg by mouth daily.   Yes [provider]  dexamethasone (DECADRON) 6 MG tablet Take 6 mg by mouth daily.   Yes [provider]  levothyroxine (SYNTHROID) 150 MCG tablet Take 150 mcg by mouth daily before breakfast.   Yes [provider]  ondansetron (ZOFRAN) 4 MG tablet Take 4 mg by mouth every 6 (six) hours as needed for nausea or vomiting.   Yes [provider]   Liver Function Tests Recent Labs  Lab 11/25/19 0431  AST 31  ALT 27  ALKPHOS 66  BILITOT 0.6  PROT 7.2  ALBUMIN 2.6*   No results for input(s): LIPASE, AMYLASE in the last 168 hours. CBC Recent Labs  Lab 11/08/2019 1348 11/25/19 0431  WBC 22.8* 23.6*  NEUTROABS 21.4* 20.3*  HGB 12.0 12.1  HCT 39.5 39.1  MCV 103.1* 101.8*  PLT 499* [id number]*   Basic Metabolic Panel Recent Labs  Lab 11/23/2019 1348 11/25/19 0431  NA 141 144  K 5.6* 5.8*  CL 96* 95*  CO2 26 25  GLUCOSE 144* 127*  BUN 77* 100*  CREATININE 7.03* 8.08*  CALCIUM 9.3 9.3  PHOS  --  9.5*   Iron/TIBC/Ferritin/ %Sat    Component Value Date/Time   FERRITIN 7,415 (H) 11/25/2019 0431    Vitals:   11/25/19 1230 11/25/19 1245 11/25/19 1300 11/25/19 1315  BP: 124/74 129/76 124/75 130/83  Pulse:   (!) 104 (!) 105  Resp: (!) 33 (!) 33 (!) 31 (!) 33  Temp:      SpO2:   97% 96%  Exam Gen pt seen in ED, pt is frail, eyes open but doesn't verbalize or follow any commands; sig contractures or LE's > UE's bilat No rash, cyanosis or gangrene Sclera anicteric, throat clear  No jvd or bruits Chest clear bilat to bases RRR no MRG Abd soft ntnd no mass or ascites +bs GU defer MS + contracture deformities of LE's> UE's Ext  No edema, no wounds or ulcers Neuro is  Nonverbal, awake  RR 28- 32, SpO2 96%  CXR negative  Assessment: 1. COVID + infection 2. Hypotension 3. ESRD - last HD was yest reportedly 4. Debility - bedbound w/ contractures LE's> UE's 5. AMS - prob acute enceph, not sure baseline 6. COPD  Rec: the patient is severely ill at  baseline, bedbound and SNF dependent, contracted, here w/ COVID infection.  Would not recommend aggressive care (dialysis, etc) given her baseline severely debilitated state.  Transition to comfort care recommended.        Kelly Splinter  MD 11/25/2019, 3:59 PM

## 2019-11-25 NOTE — ED Notes (Signed)
Pt has difficulty swallowing medications.

## 2019-11-25 NOTE — ED Notes (Signed)
Son is here visiting his mother

## 2019-11-25 NOTE — ED Notes (Deleted)
  Shaner,Cynthia Spouse [phone]

## 2019-11-25 NOTE — Progress Notes (Signed)
Called and discussed with patient's daughter, explained patient's chronically debilitated condition and nephrology recommendation regarding stopping dialysis.  Patient's family is agreeable to make her full comfort care.  Patient has extremely poor prognosis, has had poor p.o. intake for almost a week.  She is barely responding to verbal stimuli.  Will make patient full comfort care.

## 2019-11-25 DEATH — deceased

## 2019-11-26 ENCOUNTER — Encounter (HOSPITAL_COMMUNITY): Payer: Self-pay | Admitting: Family Medicine

## 2019-11-26 ENCOUNTER — Other Ambulatory Visit: Payer: Self-pay

## 2019-11-26 NOTE — Progress Notes (Addendum)
[id number] - No updates.  Received pt from ED at apx 2300. Pt is non verbal, lethargic, no attempts to communicate and not following commands. Pt assessment is as documented. Placed on cardiac monitor and 2L nasal cannula. Pt appears in no pain. Skin inspected w/ Rodman Pickle, charge nurse as documented. PIV x1 and AV fistula noted. Purewick in place. ESRD, old notes say min urine. Bed alarm on for safety.VSS taken on admission to 5W.

## 2019-11-26 NOTE — Progress Notes (Addendum)
Patient's daughter, Olivia Mackie was called and update on his mother's condition.

## 2019-11-26 NOTE — Plan of Care (Signed)
  Problem: Respiratory: Goal: Will maintain a patent airway Outcome: Progressing Goal: Complications related to the disease process, condition or treatment will be avoided or minimized Outcome: Progressing   Problem: Clinical Measurements: Goal: Will remain free from infection Outcome: Progressing Goal: Respiratory complications will improve Outcome: Progressing Goal: Cardiovascular complication will be avoided Outcome: Progressing

## 2019-11-26 NOTE — Progress Notes (Signed)
[facility]  PROGRESS NOTE  Chanetta Spehar [id number] DOB: [birth year] DOA: 11/09/2019 PCP: Patient, No Pcp Per   Brief HPI:   74 year old female with history of COPD, anxiety, ESRD on hemodialysis presented from dialysis center when her SBP was in the low 100s/above 90s.  Patient was diagnosed with COVID-19 infection approximately week ago.  She has new oxygen requirement and decreased alertness over past week.  As per patient's family she was typically alert and interactive.  In the ED patient was found to be afebrile.  Oxygen saturation high 90s on 3 L/min of oxygen.  Chest x-ray is negative for consolidation.  BUN 77, potassium 5.6.  She had leukocytosis with WBC 22,800.  Covid antigen test was positive.    Subjective   Patient seen and examined, she was made comfort care only yesterday.  Continues to be lethargic, nonverbal, opens eyes to verbal stimuli.   Assessment/Plan:     1. Acute encephalopathy-unclear etiology, patient has been declining for past 1 week after she was diagnosed with Covid 19 infection.  CT head is negative for stroke.  B12 is 870, TSH 0.624, RPR is nonreactive, ammonia is 43.  Patient encephalopathy is likely from COVID-19 infection.   2. Acute hypoxic respiratory failure-in the setting of COVID-19 pneumonia, patient is requiring 2 L/min of oxygen, O2 sats 96%.  Currently started on remdesivir and Decadron.  Inflammatory markers show CRP 13.9, ferritin 74 1 5, D-dimer 1.05.  Patient was started on remdesivir and Decadron, remdesivir has been discontinued.  Continue with Decadron.  Ceftriaxone and Zithromax have been discontinued.  Patient is comfort care only.    3. ESRD with hyperkalemia-patient has ESRD on hemodialysis, patient became hypotensive during hemodialysis and was transferred to the [facility].  Nephrology was consulted, Dr. Jonnie Finner has seen the patient and recommended  to stop hemodialysis.  4. Hypothyroidism-continue Synthroid  5. Goals of  care discussion-called and discussed with patient's daughter and explained worsening condition of patient.  Also explained the futility of hemodialysis under current circumstances.  Patient has been declining for past 7 to 8 days with poor p.o. intake.  She is barely responding to verbal stimuli.  Nephrology has seen the patient and recommends to stop hemodialysis.  Patient's family discussed moving themselves and agreed with making patient full comfort care.  Comfort measures have been initiated. 6. Patient's prognosis is hours to days.  Will refer for residential hospice placement.    SpO2: 97 % O2 Flow Rate (L/min): 2 L/min   COVID-19 Labs  Recent Labs    11/06/2019 2003 11/25/19 0431  DDIMER 1.03* 1.05*  FERRITIN 5,834* 7,415*  LDH 220*  --   CRP 14.3* 13.9*      CBC: Recent Labs  Lab 10/27/2019 1348 11/25/19 0431  WBC 22.8* 23.6*  NEUTROABS 21.4* 20.3*  HGB 12.0 12.1  HCT 39.5 39.1  MCV 103.1* 101.8*  PLT 499* 562*    Basic Metabolic Panel: Recent Labs  Lab 11/16/2019 1348 11/25/19 0431  NA 141 144  K 5.6* 5.8*  CL 96* 95*  CO2 26 25  GLUCOSE 144* 127*  BUN 77* 100*  CREATININE 7.03* 8.08*  CALCIUM 9.3 9.3  MG  --  2.8*  PHOS  --  9.5*     Liver Function Tests: Recent Labs  Lab 11/25/19 0431  AST 31  ALT 27  ALKPHOS 66  BILITOT 0.6  PROT 7.2  ALBUMIN 2.6*        DVT prophylaxis: Heparin  Code Status:  Full code  Family Communication: Discussed with daughter as above  Disposition Plan: To be decided based on patient's clinical improvement         Scheduled medications:  . dexamethasone (DECADRON) injection  6 mg Intravenous Q24H  . heparin injection (subcutaneous)  5,000 Units Subcutaneous Q8H  . Ipratropium-Albuterol  1 puff Inhalation QID  . levothyroxine  150 mcg Oral Q0600  . sodium chloride flush  3 mL Intravenous Q12H     Antibiotics:   Anti-infectives (From admission, onward)   Start     Dose/Rate Route Frequency  Ordered Stop   11/25/19 1000  remdesivir 100 mg in sodium chloride 0.9 % 100 mL IVPB  Status:  Discontinued     100 mg 200 mL/hr over 30 Minutes Intravenous Daily 11/06/2019 2050 11/22/2019 2051   11/25/19 1000  remdesivir 100 mg in sodium chloride 0.9 % 100 mL IVPB  Status:  Discontinued     100 mg 200 mL/hr over 30 Minutes Intravenous Every 24 hours 11/10/2019 1948 11/25/19 1606   11/23/2019 2050  remdesivir 200 mg in sodium chloride 0.9% 250 mL IVPB  Status:  Discontinued     200 mg 580 mL/hr over 30 Minutes Intravenous Once 10/29/2019 2050 10/28/2019 2051   11/13/2019 2000  remdesivir 200 mg in sodium chloride 0.9% 250 mL IVPB     200 mg 580 mL/hr over 30 Minutes Intravenous Once 11/08/2019 1948 11/14/2019 2150       Objective   Vitals:   11/25/19 2230 11/25/19 2311 11/26/19 0019 11/26/19 0700  BP: 121/82 110/76  122/82  Pulse: (!) 110   (!) 103  Resp: (!) 33 (!) 28  (!) 36  Temp:  97.7 F (36.5 C)  98.3 F (36.8 C)  TempSrc:  Axillary  Oral  SpO2: 97% 93%  97%  Weight:   46.6 kg   Height:   5' (1.524 m)     Intake/Output Summary (Last 24 hours) at 11/26/2019 1404 Last data filed at 11/26/2019 0522 Gross per 24 hour  Intake 0 ml  Output 0 ml  Net 0 ml    12/31 1901 - 01/02 0700 In: 350  Out: 0    Physical Examination:    General-appears lethargic  Heart-S1-S2, regular, no murmur auscultated  Lungs-clear to auscultation bilaterally, no wheezing or crackles auscultated  Abdomen-soft, nontender, no organomegaly  Extremities-no edema in the lower extremities  Neuro-somnolent but arousable to verbal stimuli, nonverbal    Data Reviewed:   Recent Results (from the past 240 hour(s))  Blood Culture (routine x 2)     Status: None (Preliminary result)   Collection Time: 11/03/2019  7:45 PM   Specimen: BLOOD RIGHT HAND  Result Value Ref Range Status   Specimen Description BLOOD RIGHT HAND  Final   Special Requests   Final    BOTTLES DRAWN AEROBIC AND ANAEROBIC Blood Culture  results may not be optimal due to an inadequate volume of blood received in culture bottles   Culture   Final    NO GROWTH 2 DAYS Performed at [facility], [place] [address] [place], [place] [address]    Report Status PENDING  Incomplete  Blood Culture (routine x 2)     Status: None (Preliminary result)   Collection Time: 10/31/2019  8:05 PM   Specimen: BLOOD RIGHT ARM  Result Value Ref Range Status   Specimen Description BLOOD RIGHT ARM  Final   Special Requests   Final    BOTTLES DRAWN AEROBIC  AND ANAEROBIC Blood Culture results may not be optimal due to an inadequate volume of blood received in culture bottles   Culture   Final    NO GROWTH 2 DAYS Performed at [facility] [address] [place], [place] [address]    Report Status PENDING  Incomplete    No results for input(s): LIPASE, AMYLASE in the last 168 hours. Recent Labs  Lab 11/12/2019 2021  AMMONIA 43*     Studies:  CT HEAD WO CONTRAST  Result Date: 11/06/2019 CLINICAL DATA:  Tachypnea. Decreased level of consciousness. COVID positive. EXAM: CT HEAD WITHOUT CONTRAST TECHNIQUE: Contiguous axial images were obtained from the base of the skull through the vertex without intravenous contrast. COMPARISON:  None. FINDINGS: Brain: No evidence of acute infarction, hemorrhage, hydrocephalus, extra-axial collection or mass lesion/mass effect. Atrophy and chronic microvascular ischemic changes are noted. Vascular: No hyperdense vessel or unexpected calcification. Skull: Normal. Negative for fracture or focal lesion. Sinuses/Orbits: No acute finding. Other: None. IMPRESSION: No acute findings by noncontrast CT. Atrophy and chronic microvascular ischemic changes. Electronically Signed   By: Constance Holster M.D.   On: 10/31/2019 21:22     Admission status: Inpatient: Based on patients clinical presentation and evaluation of above clinical data, I have made determination that patient meets Inpatient criteria at this  time.   Oswald Hillock   [facility] If 7PM-7AM, please contact night-coverage at [email or web address], Office  [phone]  password [email or web address]  11/26/2019, 2:04 PM  LOS: 1 day

## 2019-11-26 NOTE — Social Work (Addendum)
5:02pm- estimate for [facility] to hospice home in [place] is approximately around $1516.86 - they wouldn't be able to process payment until Monday. Referral pending with [facility] at this time.   4:21pm- Spoke with Butch Penny at [facility] [place]. Their inpatient hospice home does take referrals for COVID+ patients. Referral can be sent to [phone], also have reached out to [facility] for cost estimate.   3:49pm- CSW acknowledging consult for comfort care. Pt family agreeable to residential hospice. Spoke with pt daughter [name] at [phone]. Pt daughter aware of [facility] being the only current hospice home able to take COVID+ pts. She is amenable to referral being made- would also like referral made to [facility]. We did discuss that ambulance may be pricey and would be private pay if they do take COVID+ pts.   Referral made to [facility] with [facility] to contact pt daughter.   Alexander Mt, [place] Work

## 2019-11-26 NOTE — Progress Notes (Signed)
Unable to complete the majority of the admission navigator due to patient's altered mental status.

## 2019-11-26 NOTE — Progress Notes (Signed)
Pt is now comfort care, will sign off.   Kelly Splinter, MD 11/26/2019, 12:37 PM

## 2019-11-27 DIAGNOSIS — Z515 Encounter for palliative care: Secondary | ICD-10-CM

## 2019-11-27 MED ORDER — ACETAMINOPHEN 650 MG RE SUPP
650.0000 mg | RECTAL | Status: DC | PRN
  Administered 2019-11-28 (×2): 650 mg via RECTAL
  Filled 2019-11-27 (×2): qty 1

## 2019-11-27 MED ORDER — ACETAMINOPHEN 120 MG RE SUPP
60.0000 mg | RECTAL | Status: DC | PRN
  Administered 2019-11-27: 19:00:00 60 mg via RECTAL
  Filled 2019-11-27 (×2): qty 1

## 2019-11-27 NOTE — Progress Notes (Signed)
PROGRESS NOTE  Jeanette Simon [id number] DOB: 01-20-1946 DOA: 11/16/2019 PCP: Patient, No Pcp Per  HPI/Recap of past 29 hours: 74 year old female with history of COPD, anxiety, ESRD on hemodialysis presented from dialysis center when her SBP was in the low 100s/above 90s.  Patient was diagnosed with COVID-19 infection approximately week ago.  She has new oxygen requirement and decreased alertness over past week.  As per patient's family she was typically alert and interactive.  In the ED patient was found to be afebrile.  Oxygen saturation high 90s on 3 L/min of oxygen.  Chest x-ray is negative for consolidation.  BUN 77, potassium 5.6.  She had leukocytosis with WBC 22,800.  Covid antigen test was positive 11/07/2019.  Family made decision for comfort care only on 11/26/2019.  11/27/2019: Seen and examined.  Minimally responsive.  On comfort measures.  CSW working on disposition to residential hospice.  Palliative care consulted to assist.  Assessment/Plan: Principal Problem:   Comfort measures only status Active Problems:   ESRD (end stage renal disease) (HCC)   Hyperkalemia   COPD (chronic obstructive pulmonary disease) (HCC)   Anxiety   COVID-19 virus infection   Acute encephalopathy   Acute respiratory failure with hypoxia (HCC)   Hypothyroidism   Encephalopathy   DNR (do not resuscitate)  Assessment: Acute encephalopathy in the setting of COVID-19 viral infection. Acute hypoxic respiratory failure secondary to COVID-19 viral pneumonia. ESRD on HD Hypothyroidism Failure to thrive  Plan: Family made decision for comfort care only on 11/26/2019. Comfort measures in place. CSW working on disposition to residential hospice Palliative care team has been consulted to assist.   Code Status: DNR comfort care.  Family Communication: None at bedside.  Disposition Plan: Likely discharge to residential hospice.   Consultants:  Palliative care  team.  Procedures:  None  Antimicrobials:  None  DVT prophylaxis: None/comfort care only.   Objective: Vitals:   11/26/19 0700 11/26/19 1534 11/27/19 0215 11/27/19 0732  BP: 122/82 115/76 99/62   Pulse: (!) 103  (!) 112   Resp: (!) 36 (!) 30 (!) 24   Temp: 98.3 F (36.8 C) 98.7 F (37.1 C) 99.9 F (37.7 C) 99.6 F (37.6 C)  TempSrc: Oral Rectal Oral Rectal  SpO2: 97% 95% 94%   Weight:      Height:        Intake/Output Summary (Last 24 hours) at 11/27/2019 0949 Last data filed at 11/27/2019 0600 Gross per 24 hour  Intake 0 ml  Output 0 ml  Net 0 ml   Filed Weights   11/26/19 0019  Weight: 46.6 kg    Exam:  . General: 74 y.o. year-old female frail-appearing minimally responsive. . Cardiovascular: Regular rate and rhythm with no rubs or gallops.   Marland Kitchen Respiratory: Mild rales at bases no noted.  No wheezing.  Poor inspiratory effort.   . Abdomen: Soft nontender nondistended with normal bowel sounds.  . Psychiatry: Unable to assess mood due to altered mental status.  Data Reviewed: CBC: Recent Labs  Lab 11/20/2019 1348 11/25/19 0431  WBC 22.8* 23.6*  NEUTROABS 21.4* 20.3*  HGB 12.0 12.1  HCT 39.5 39.1  MCV 103.1* 101.8*  PLT 499* [id number]*   Basic Metabolic Panel: Recent Labs  Lab 11/08/2019 1348 11/25/19 0431  NA 141 144  K 5.6* 5.8*  CL 96* 95*  CO2 26 25  GLUCOSE 144* 127*  BUN 77* 100*  CREATININE 7.03* 8.08*  CALCIUM 9.3 9.3  MG  --  2.8*  PHOS  --  9.5*   GFR: Estimated Creatinine Clearance: 4.3 mL/min (A) (by C-G formula based on SCr of 8.08 mg/dL (H)). Liver Function Tests: Recent Labs  Lab 11/25/19 0431  AST 31  ALT 27  ALKPHOS 66  BILITOT 0.6  PROT 7.2  ALBUMIN 2.6*   No results for input(s): LIPASE, AMYLASE in the last 168 hours. Recent Labs  Lab 11/21/2019 2021  AMMONIA 43*   Coagulation Profile: No results for input(s): INR, PROTIME in the last 168 hours. Cardiac Enzymes: No results for input(s): CKTOTAL, CKMB, CKMBINDEX,  TROPONINI in the last 168 hours. BNP (last 3 results) No results for input(s): PROBNP in the last 8760 hours. HbA1C: No results for input(s): HGBA1C in the last 72 hours. CBG: No results for input(s): GLUCAP in the last 168 hours. Lipid Profile: Recent Labs    11/19/2019 2004  TRIG 364*   Thyroid Function Tests: Recent Labs    11/03/2019 1937  TSH 0.624   Anemia Panel: Recent Labs    11/06/2019 1937 11/01/2019 2003 11/25/19 0431  [id number]  --   --   FERRITIN  --  5,834* 7,415*   Urine analysis: No results found for: COLORURINE, APPEARANCEUR, LABSPEC, PHURINE, GLUCOSEU, HGBUR, BILIRUBINUR, KETONESUR, PROTEINUR, UROBILINOGEN, NITRITE, LEUKOCYTESUR Sepsis Labs: @LABRCNTIP (procalcitonin:4,lacticidven:4)  ) Recent Results (from the past 240 hour(s))  Blood Culture (routine x 2)     Status: None (Preliminary result)   Collection Time: 11/11/2019  7:45 PM   Specimen: BLOOD RIGHT HAND  Result Value Ref Range Status   Specimen Description BLOOD RIGHT HAND  Final   Special Requests   Final    BOTTLES DRAWN AEROBIC AND ANAEROBIC Blood Culture results may not be optimal due to an inadequate volume of blood received in culture bottles   Culture   Final    NO GROWTH 3 DAYS Performed at [facility], [place] [address] [place], [place] [address]    Report Status PENDING  Incomplete  Blood Culture (routine x 2)     Status: None (Preliminary result)   Collection Time: 11/12/2019  8:05 PM   Specimen: BLOOD RIGHT ARM  Result Value Ref Range Status   Specimen Description BLOOD RIGHT ARM  Final   Special Requests   Final    BOTTLES DRAWN AEROBIC AND ANAEROBIC Blood Culture results may not be optimal due to an inadequate volume of blood received in culture bottles   Culture   Final    NO GROWTH 3 DAYS Performed at [facility], [place] [address], [place], [place] [address]    Report Status PENDING  Incomplete      Studies: No results found.  Scheduled Meds: . sodium  chloride flush  3 mL Intravenous Q12H    Continuous Infusions:   LOS: 2 days     Kayleen Memos, MD [facility] Pager [phone]  If 7PM-7AM, please contact night-coverage [email or web address] Password [email or web address] 11/27/2019, 9:49 AM

## 2019-11-29 LAB — CULTURE, BLOOD (ROUTINE X 2)
Culture: NO GROWTH
Culture: NO GROWTH

## 2019-12-26 NOTE — Death Summary Note (Signed)
Death Summary  Sarahbeth Orlosky [id number] DOB: 1946-04-15 DOA: Dec 23, 2019  PCP: Patient, No Pcp Per  Admit date: 12-23-2019 Date of Death: 12/27/2019 Time of Death: 0858 Notification: Patient, No Pcp Per notified of death of December 27, 2019   History of present illness:  Jali Cockfield is a 74 y.o. female lives at [facility] with a history of COPD, chronic anxiety, hypothyroidism, ESRD on hemodialysis presented from dialysis center due to hypotension and confusion. Patient was diagnosed with COVID-19 infection approximately week ago.  Was prescribed Eliquis and decadron. She had new oxygen requirement and decreased alertness over past week. As per patient's family she was typically alert and interactive. In the ED, hypoxic, leukocytosis with WBC 22,800. Covid antigen test positive December 23, 2019.  Due to no improvement with poor prognosis, family made decision for full comfort care on 11/25/2019.  Ms. Aundreya Caravello expired on 2019-12-27 at [id number].  Principal Problem:   Comfort measures only status Active Problems:   ESRD (end stage renal disease) (HCC)   Hyperkalemia   COPD (chronic obstructive pulmonary disease) (HCC)   Anxiety   COVID-19 virus infection   Acute encephalopathy   Acute respiratory failure with hypoxia (HCC)   Hypothyroidism   Encephalopathy   DNR (do not resuscitate)  Failure to thrive   Comfort Care    Final Diagnoses:  1.   Cardiopulmonary arrest 2.    COVID-19 viral pneumonia superimposed by bacterial pneumonia 3.    Acute hypoxic respiratory failure secondary to COVID-19 viral pneumonia 4.    ESRD on hemodialysis 5.    Failure to thrive 6.    Hypothyroidism 7.    Hyperkalemia 8.    Acute metabolic encephalopathy  Patient was made comfort care only by family on 11/25/2019.  Received comfort care measures.   The results of significant diagnostics from this hospitalization (including imaging, microbiology, ancillary and laboratory) are listed below for reference.     Significant Diagnostic Studies: CT HEAD WO CONTRAST  Result Date: 12-23-2019 CLINICAL DATA:  Tachypnea. Decreased level of consciousness. COVID positive. EXAM: CT HEAD WITHOUT CONTRAST TECHNIQUE: Contiguous axial images were obtained from the base of the skull through the vertex without intravenous contrast. COMPARISON:  None. FINDINGS: Brain: No evidence of acute infarction, hemorrhage, hydrocephalus, extra-axial collection or mass lesion/mass effect. Atrophy and chronic microvascular ischemic changes are noted. Vascular: No hyperdense vessel or unexpected calcification. Skull: Normal. Negative for fracture or focal lesion. Sinuses/Orbits: No acute finding. Other: None. IMPRESSION: No acute findings by noncontrast CT. Atrophy and chronic microvascular ischemic changes. Electronically Signed   By: Constance Holster M.D.   On: December 23, 2019 21:22   DG Chest Port 1 View  Result Date: December 23, 2019 CLINICAL DATA:  Weakness and hypotension.  Renal failure. EXAM: PORTABLE CHEST 1 VIEW COMPARISON:  None FINDINGS: No edema or consolidation. Heart size and pulmonary vascularity are normal. No adenopathy. There is aortic atherosclerosis. No bone lesions. IMPRESSION: No edema or consolidation. Heart size normal. No adenopathy. Aortic Atherosclerosis (ICD10-I70.0). Electronically Signed   By: Lowella Grip III M.D.   On: December 23, 2019 14:13    Microbiology: Recent Results (from the past 240 hour(s))  Blood Culture (routine x 2)     Status: None (Preliminary result)   Collection Time: 12-23-19  7:45 PM   Specimen: BLOOD RIGHT HAND  Result Value Ref Range Status   Specimen Description BLOOD RIGHT HAND  Final   Special Requests   Final    BOTTLES DRAWN AEROBIC AND ANAEROBIC Blood Culture results may not be optimal due  to an inadequate volume of blood received in culture bottles   Culture   Final    NO GROWTH 4 DAYS Performed at [facility], [place] [address] [place], [place] [address]    Report  Status PENDING  Incomplete  Blood Culture (routine x 2)     Status: None (Preliminary result)   Collection Time: 11/17/2019  8:05 PM   Specimen: BLOOD RIGHT ARM  Result Value Ref Range Status   Specimen Description BLOOD RIGHT ARM  Final   Special Requests   Final    BOTTLES DRAWN AEROBIC AND ANAEROBIC Blood Culture results may not be optimal due to an inadequate volume of blood received in culture bottles   Culture   Final    NO GROWTH 4 DAYS Performed at [facility], [place] [address], [place], [place] [address]    Report Status PENDING  Incomplete     Labs: Basic Metabolic Panel: Recent Labs  Lab 11/05/2019 1348 11/25/19 0431  NA 141 144  K 5.6* 5.8*  CL 96* 95*  CO2 26 25  GLUCOSE 144* 127*  BUN 77* 100*  CREATININE 7.03* 8.08*  CALCIUM 9.3 9.3  MG  --  2.8*  PHOS  --  9.5*   Liver Function Tests: Recent Labs  Lab 11/25/19 0431  AST 31  ALT 27  ALKPHOS 66  BILITOT 0.6  PROT 7.2  ALBUMIN 2.6*   No results for input(s): LIPASE, AMYLASE in the last 168 hours. Recent Labs  Lab 10/26/2019 2021  AMMONIA 43*   CBC: Recent Labs  Lab 11/12/2019 1348 11/25/19 0431  WBC 22.8* 23.6*  NEUTROABS 21.4* 20.3*  HGB 12.0 12.1  HCT 39.5 39.1  MCV 103.1* 101.8*  PLT 499* 562*   Cardiac Enzymes: No results for input(s): CKTOTAL, CKMB, CKMBINDEX, TROPONINI in the last 168 hours. D-Dimer No results for input(s): DDIMER in the last 72 hours. BNP: Invalid input(s): POCBNP CBG: No results for input(s): GLUCAP in the last 168 hours. Anemia work up No results for input(s): VITAMINB12, FOLATE, FERRITIN, TIBC, IRON, RETICCTPCT in the last 72 hours. Urinalysis No results found for: COLORURINE, APPEARANCEUR, LABSPEC, [place], GLUCOSEU, HGBUR, BILIRUBINUR, KETONESUR, PROTEINUR, UROBILINOGEN, NITRITE, LEUKOCYTESUR Sepsis Labs Invalid input(s): PROCALCITONIN,  WBC,  LACTICIDVEN     SIGNED:  Kayleen Memos, MD  [facility] 2019/12/11, 3:38 PM Pager   If 7PM-7AM,  please contact night-coverage [email or web address] Password [email or web address]

## 2019-12-26 NOTE — Progress Notes (Signed)
Ccmd notified charge nurse pt was asystole on the monitor, nurse went to assess pt, no respirations or pulse found. Verified with Ramonita Lab RN Time of death [phone] Dr. [place] Crane notified, and Daughter Chea notified

## 2019-12-26 NOTE — Progress Notes (Signed)
Palliative Medicine RN Note: Consult order noted. Discussed patient with palliative providers.  Unfortunately, our team is experiencing very high consult volume, and it is unclear when we will be able to see Jeanette Simon. The "End of Life" order set is available to any provider in the interim.  We will continue to watch Jeanette Simon progress until we have an available provider. I'm hopeful that she will get a hospice bed soon. If an urgent concern arises, please call our office.  Marjie Skiff Kinze Labo, RN, BSN, [facility] Palliative Medicine Team 11/30/2019 11:49 AM Office [phone]

## 2019-12-26 DEATH — deceased

## 2020-09-02 IMAGING — CT CT HEAD W/O CM
3 series · 16 of 47 positions shown, 19 images · non-contrast
Comparison: None.

CLINICAL DATA: Tachypnea. Decreased level of consciousness. COVID
positive.

EXAM:
CT HEAD WITHOUT CONTRAST
TECHNIQUE: Contiguous axial images were obtained from the base of the skull
through the vertex without intravenous contrast.

[Series 3: head 5.0 h30s · axial · 0.41mm/px · z∈[-139,+1]mm · 10 of 34 slices shown, 13 images]
[im 3/34  brain]
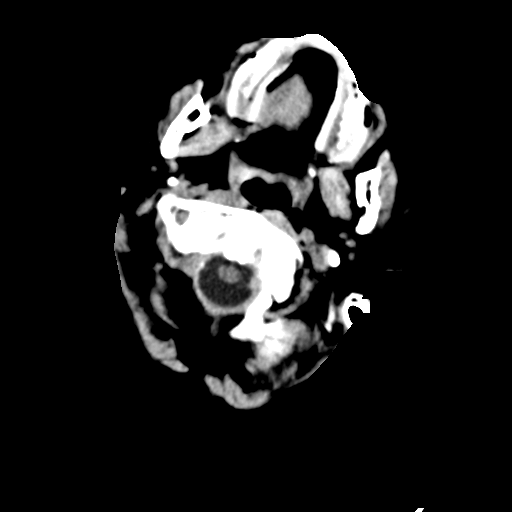
[im 3/34  bone]
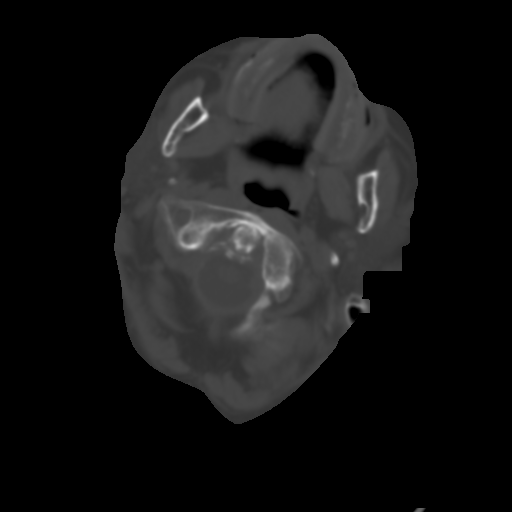
[im 6/34  brain]
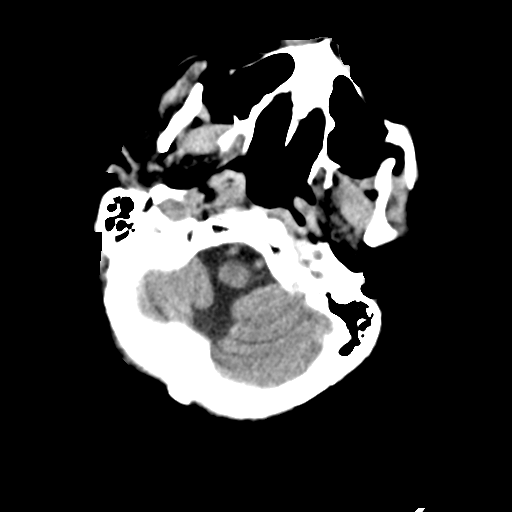
[im 10/34  brain]
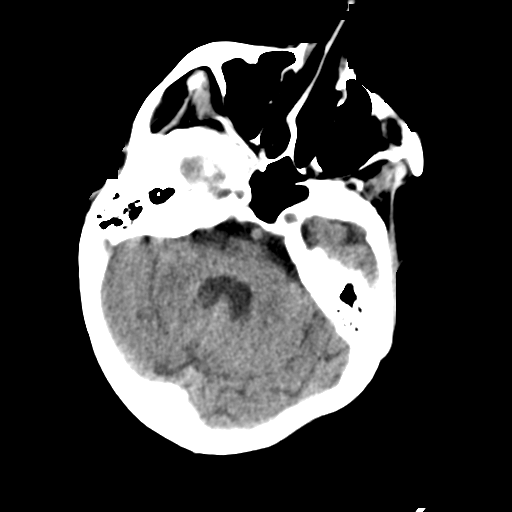
[im 12/34  brain]
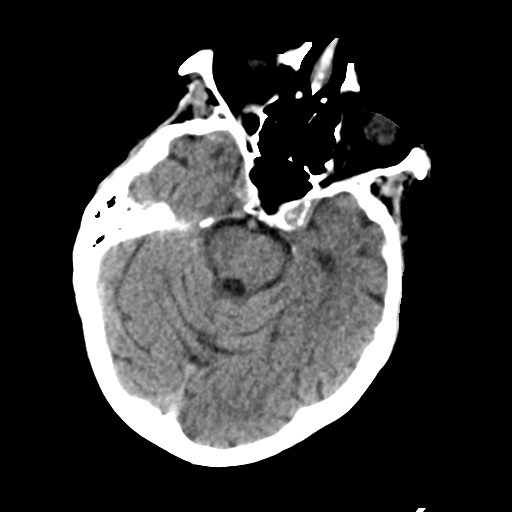
[im 15/34  brain]
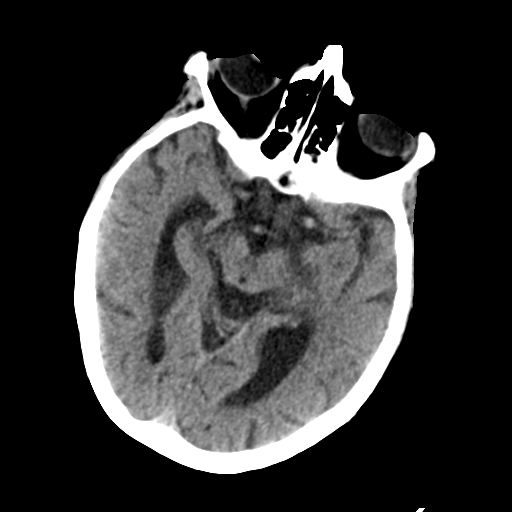
[im 15/34  bone]
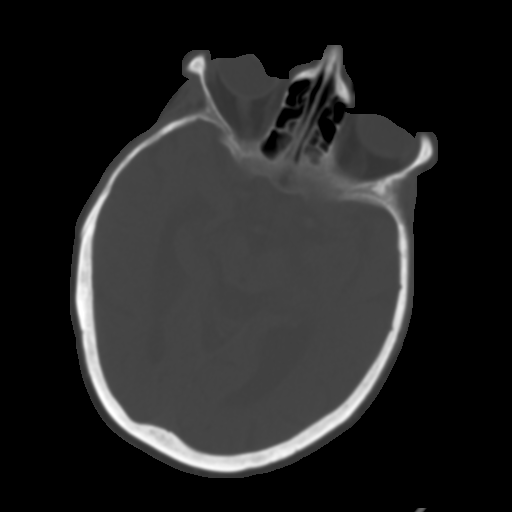
[im 19/34  brain]
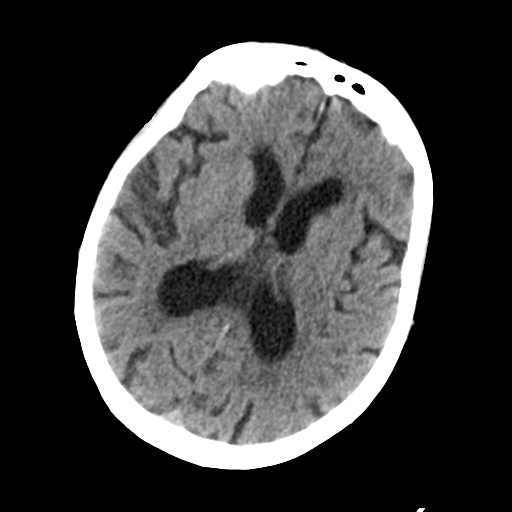
[im 22/34  brain]
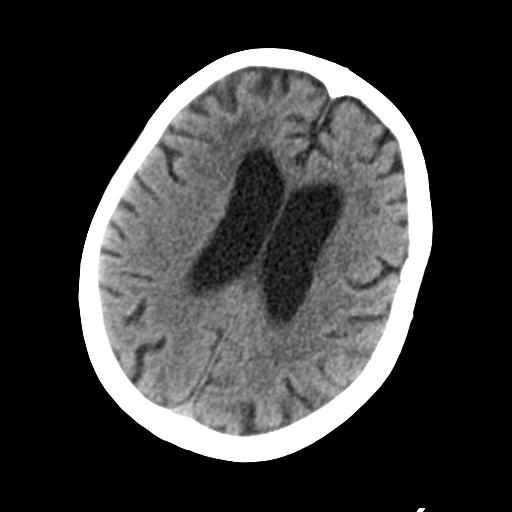
[im 26/34  brain]
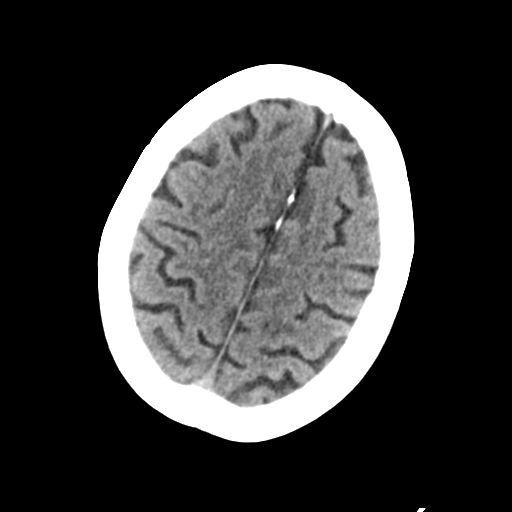
[im 28/34  brain]
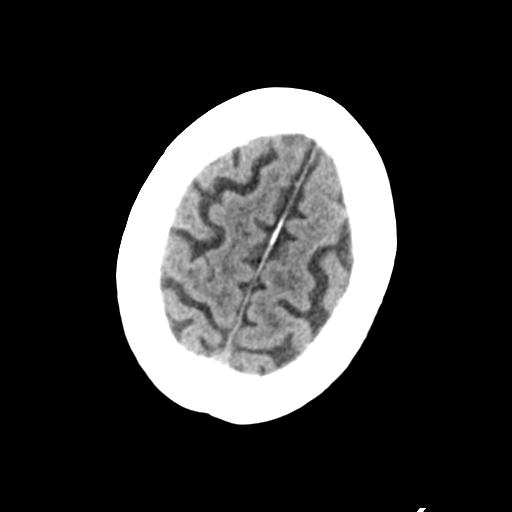
[im 28/34  bone]
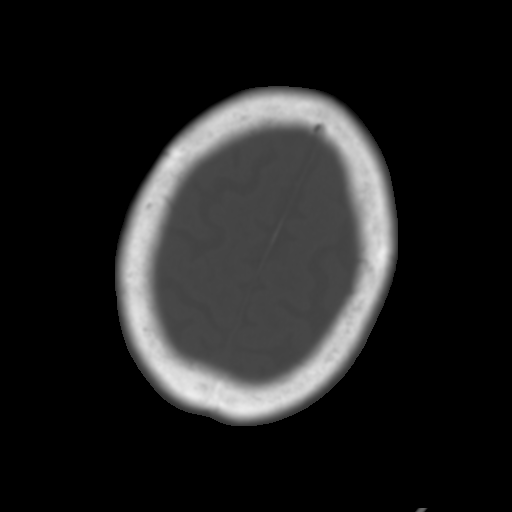
[im 31/34  brain]
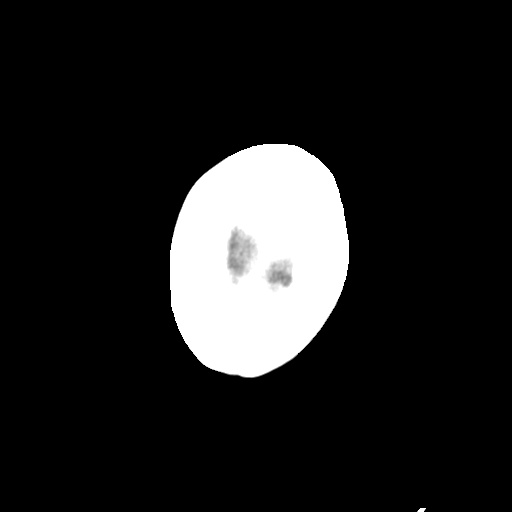

[Series 5: head 3.0 mpr cor · coronal · 0.31mm/px · 3 of 67 slices shown]
[im 23/67  brain]
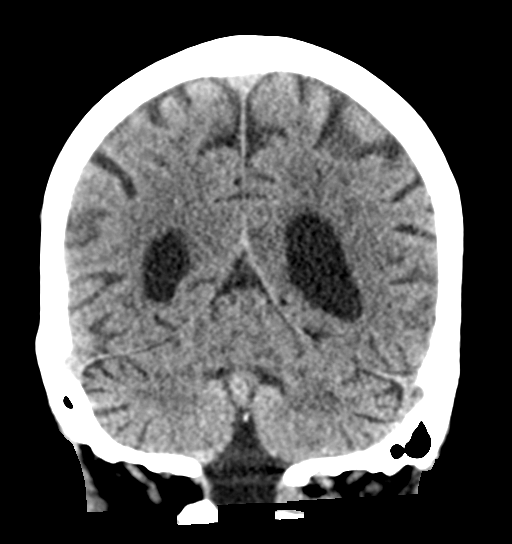
[im 30/67  brain]
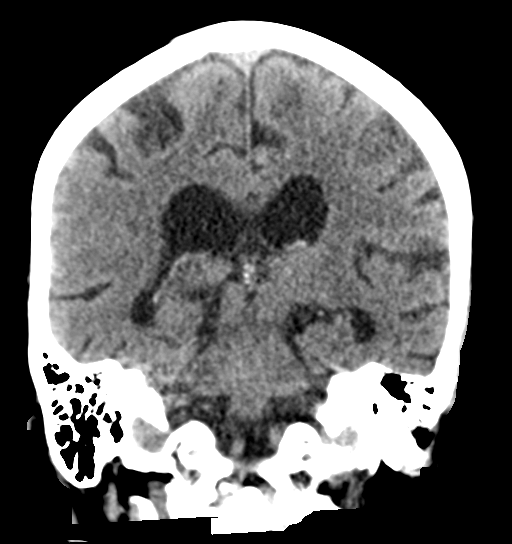
[im 37/67  brain]
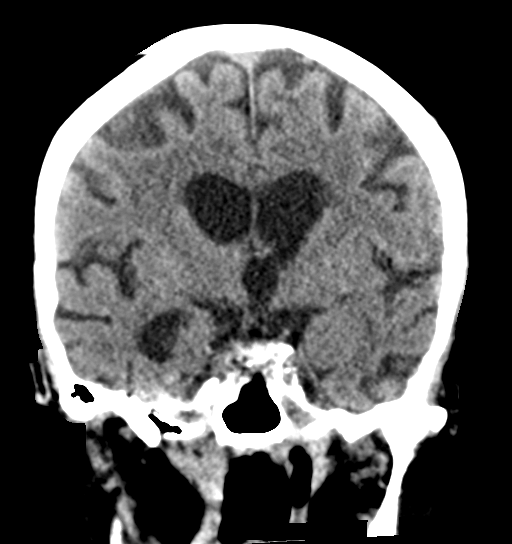

[Series 6: head 3.0 mpr sag · sagittal · 0.34mm/px · 3 of 51 slices shown]
[im 17/51  brain]
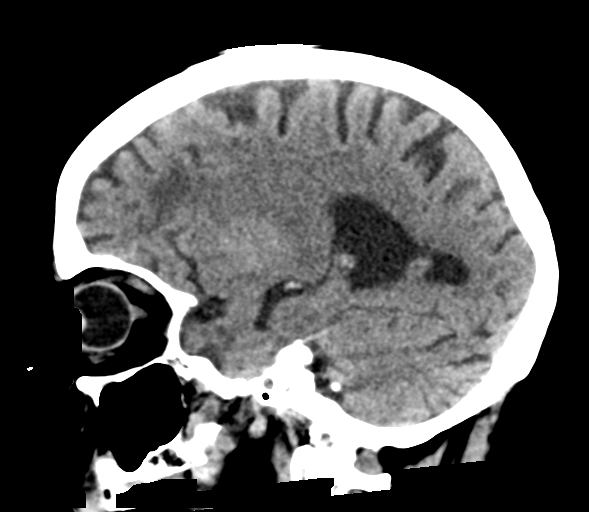
[im 26/51  brain]
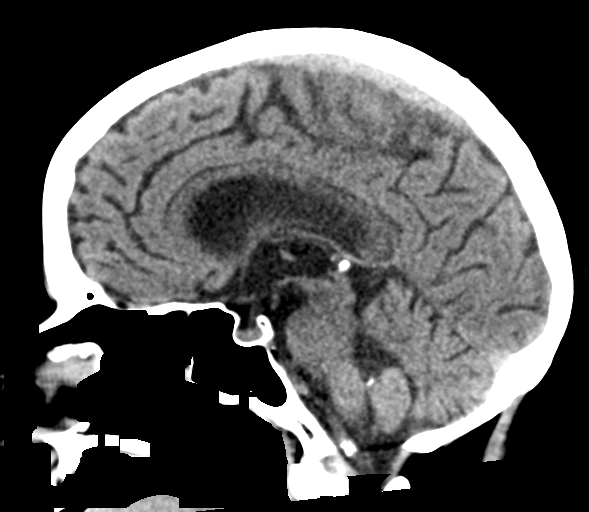
[im 34/51  brain]
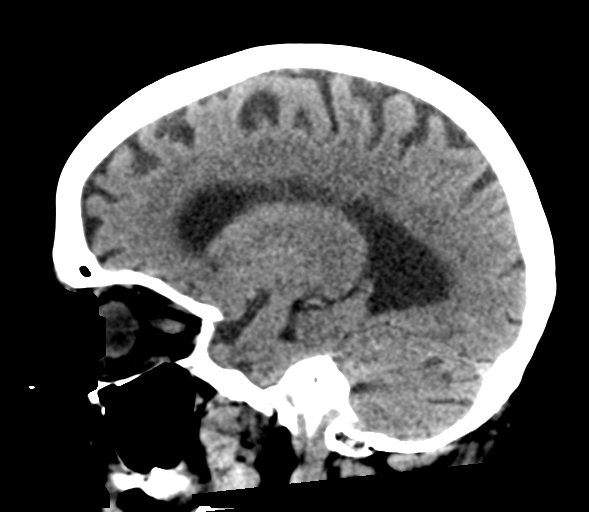

[16 of 47 positions shown; findings below may reference images not displayed]

FINDINGS: Brain: No evidence of acute infarction, hemorrhage, hydrocephalus,
extra-axial collection or mass lesion/mass effect. Atrophy and
chronic microvascular ischemic changes are noted.

Vascular: No hyperdense vessel or unexpected calcification.

Skull: Normal. Negative for fracture or focal lesion.

Sinuses/Orbits: No acute finding.

Other: None.
IMPRESSION: No acute findings by noncontrast CT.

Atrophy and chronic microvascular ischemic changes.
